# Patient Record
Sex: Male | Born: 1965 | Race: White | Hispanic: No | Marital: Single | Smoking: Current every day smoker
Health system: Southern US, Community
[De-identification: ages and names within clinical notes are randomized; demographics above are authoritative.]

## PROBLEM LIST (undated history)

## (undated) DIAGNOSIS — K4090 Unilateral inguinal hernia, without obstruction or gangrene, not specified as recurrent: Secondary | ICD-10-CM

---

## 2013-01-05 ENCOUNTER — Emergency Department (HOSPITAL_COMMUNITY)
Admission: EM | Admit: 2013-01-05 | Discharge: 2013-01-05 | Disposition: A | Discharge status: Home / Self Care | Payer: Self-pay | Attending: Emergency Medicine | Admitting: Emergency Medicine

## 2013-01-05 ENCOUNTER — Encounter (HOSPITAL_COMMUNITY): Payer: Self-pay | Admitting: Cardiology

## 2013-01-05 DIAGNOSIS — F17200 Nicotine dependence, unspecified, uncomplicated: Secondary | ICD-10-CM | POA: Insufficient documentation

## 2013-01-05 DIAGNOSIS — L0231 Cutaneous abscess of buttock: Secondary | ICD-10-CM | POA: Insufficient documentation

## 2013-01-05 MED ORDER — SULFAMETHOXAZOLE-TRIMETHOPRIM 800-160 MG PO TABS: 1.0000 | ORAL_TABLET | Freq: Two times a day (BID) | ORAL | Status: DC

## 2013-01-05 MED ORDER — SULFAMETHOXAZOLE-TMP DS 800-160 MG PO TABS
1.0000 | ORAL_TABLET | Freq: Once | ORAL | Status: AC
  Administered 2013-01-05: 1 via ORAL
  Filled 2013-01-05: qty 1

## 2013-01-05 MED ORDER — SULFAMETHOXAZOLE-TMP DS 800-160 MG PO TABS
1.0000 | ORAL_TABLET | Freq: Once | ORAL | Status: DC
  Filled 2013-01-05: qty 1

## 2013-01-05 MED ORDER — OXYCODONE-ACETAMINOPHEN 5-325 MG PO TABS: ORAL_TABLET | ORAL | Status: DC

## 2013-01-05 NOTE — ED Provider Notes (Signed)
History    This chart was scribed for a non-physician practitioner, Wynetta Emery, working with Leonette Most B. Bernette Mayers, MD by Frederik Pear, ED Scribe. This patient was seen in room TR08C/TR08C and the patient's care was started at 1403.   CSN: [id number]  Arrival date & time 01/05/13  1215   First MD Initiated Contact with Patient 01/05/13 1403      Chief Complaint  Patient presents with  . Abscess    (Consider location/radiation/quality/duration/timing/severity/associated sxs/prior treatment) The history is provided by the patient and medical records. No language interpreter was used.    HPI Comments: Mark Michael is a 47 y.o. male who presents to the Emergency Department complaining of a gradually worsening, non-draining abscess to the left gluteus that was first noticed suddenly 9 days ago. He reports that he had noticed another abscess to left forearm 11 days ago that he initially thought was a spider bite, but denies seeing a spider on or around his skin. He has no h/o of previous abscesses. Denies fever, nausea, and emesis. He has no chronic medical conditions that require daily medications.  History reviewed. No pertinent past medical history.  History reviewed. No pertinent past surgical history.  History reviewed. No pertinent family history.  History  Substance Use Topics  . Smoking status: Current Every Day Smoker  . Smokeless tobacco: Not on file  . Alcohol Use: No      Review of Systems  Respiratory: Negative for shortness of breath.   Cardiovascular: Negative for chest pain.  Gastrointestinal: Negative for abdominal pain and diarrhea.  Skin:       Abscess  All other systems reviewed and are negative.    Allergies  Codeine and Penicillins  Home Medications   Current Outpatient Rx  Name  Route  Sig  Dispense  Refill  . ibuprofen (ADVIL,MOTRIN) 200 MG tablet   Oral   Take 800 mg by mouth every 6 (six) hours as needed for pain.           BP  141/80  Pulse 66  Temp(Src) 97.7 F (36.5 C) (Oral)  Resp 18  SpO2 100%  Physical Exam  Nursing note and vitals reviewed. Constitutional: He is oriented to person, place, and time. He appears well-developed and well-nourished. No distress.  HENT:  Head: Normocephalic.  Eyes: Conjunctivae and EOM are normal.  Cardiovascular: Normal rate.   Pulmonary/Chest: Effort normal. No stridor.  Musculoskeletal: Normal range of motion.  Neurological: He is alert and oriented to person, place, and time.  Skin:  5 cm abscess to the left gluteus with central fluctuance and minimal surrounding cellulitis. Healing abscess to left forearm.  Psychiatric: He has a normal mood and affect.    ED Course  Procedures (including critical care time)  DIAGNOSTIC STUDIES: Oxygen Saturation is 100% on room air, normal by my interpretation.    COORDINATION OF CARE:  15:01- Discussed planned course of treatment with the patient, ,including an I&D, who is agreeable at this time.  15:05- INCISION AND DRAINAGE Performed by: Wynetta Emery, PA-C Consent: Verbal consent obtained. Risks and benefits: risks, benefits and alternatives were discussed Type: abscess  Body area: left gluteus  Anesthesia: local infiltration  Incision was made with a scalpel.  Local anesthetic: lidocaine 2% with epinephrine  Anesthetic total: 5 ml  Complexity: complex Blunt dissection to break up loculations  Drainage: purulent  Drainage amount: 50 mL  Packing material: None  Patient tolerance: Patient tolerated the procedure well with no immediate complications.  Labs  Reviewed - No data to display No results found.   1. Abscess, gluteal, left       MDM   Mark Michael is a 47 y.o. male with a large fluctuant left gluteal abscess, no systemic signs of infection. I&D performed with extensive purulent drainage.  Filed Vitals:   01/05/13 1228 01/05/13 1541  BP: 141/80 121/90  Pulse: 66 77  Temp: 97.7 F  (36.5 C)   TempSrc: Oral   Resp: 18   SpO2: 100% 100%     Pt verbalized understanding and agrees with care plan. Outpatient follow-up and return precautions given.    Discharge Medication List as of 01/05/2013  3:24 PM    START taking these medications   Details  oxyCODONE-acetaminophen (PERCOCET/ROXICET) 5-325 MG per tablet 1 to 2 tabs PO q6hrs  PRN for pain, Print    sulfamethoxazole-trimethoprim (SEPTRA DS) 800-160 MG per tablet Take 1 tablet by mouth every 12 (twelve) hours., Starting 01/05/2013, Until Discontinued, Print        I personally performed the services described in this documentation, which was scribed in my presence. The recorded information has been reviewed and is accurate.         Wynetta Emery, PA-C 01/06/13 1700

## 2013-01-05 NOTE — ED Notes (Signed)
Pt has abscess on rt forearm that he believes started as spider bite.  Pt states the one on his arm errupted and he squeezed the puss out of it and it seems to be healing.  Area is red and swollen with scabbed over area in center.   Pt also reports another abscess on left buttock.  Pt states it is read swollen and very painful.  Pt denies having ingrown hair that he is aware of just noticed the abscess two days ago and pain is increasing. Pt alert oriented X4

## 2013-01-05 NOTE — ED Notes (Signed)
Pt reports that he noticed an abscess on his left arm a couple of days ago. States that he then developed any area on his buttocks. Denies any fever but does report redness and drainage.

## 2013-01-08 NOTE — ED Provider Notes (Signed)
Medical screening examination/treatment/procedure(s) were performed by non-physician practitioner and as supervising physician I was immediately available for consultation/collaboration.   Hurman Horn, MD 01/08/13 [phone]

## 2014-02-15 ENCOUNTER — Emergency Department: Payer: Self-pay | Admitting: Emergency Medicine

## 2014-02-15 LAB — COMPREHENSIVE METABOLIC PANEL
ALBUMIN: 3.5 g/dL (ref 3.4–5.0)
ALT: 21 U/L (ref 12–78)
ANION GAP: 5 — AB (ref 7–16)
Alkaline Phosphatase: 75 U/L
BUN: 13 mg/dL (ref 7–18)
Bilirubin,Total: 0.5 mg/dL (ref 0.2–1.0)
CALCIUM: 8.7 mg/dL (ref 8.5–10.1)
CHLORIDE: 107 mmol/L (ref 98–107)
CO2: 28 mmol/L (ref 21–32)
CREATININE: 1.07 mg/dL (ref 0.60–1.30)
GLUCOSE: 79 mg/dL (ref 65–99)
OSMOLALITY: 278 (ref 275–301)
Potassium: 3.9 mmol/L (ref 3.5–5.1)
SGOT(AST): 23 U/L (ref 15–37)
SODIUM: 140 mmol/L (ref 136–145)
Total Protein: 6.9 g/dL (ref 6.4–8.2)

## 2014-02-15 LAB — CBC
HCT: 44.4 % (ref 40.0–52.0)
HGB: 14.7 g/dL (ref 13.0–18.0)
MCH: 30.2 pg (ref 26.0–34.0)
MCHC: 33.1 g/dL (ref 32.0–36.0)
MCV: 91 fL (ref 80–100)
PLATELETS: 183 10*3/uL (ref 150–440)
RBC: 4.87 10*6/uL (ref 4.40–5.90)
RDW: 14 % (ref 11.5–14.5)
WBC: 4.3 10*3/uL (ref 3.8–10.6)

## 2014-02-15 LAB — URINALYSIS, COMPLETE: Specific Gravity: 1.027 (ref 1.003–1.030)

## 2014-02-17 LAB — URINE CULTURE

## 2017-02-07 ENCOUNTER — Encounter: Payer: Self-pay | Admitting: Emergency Medicine

## 2017-02-07 ENCOUNTER — Emergency Department
Admission: EM | Admit: 2017-02-07 | Discharge: 2017-02-07 | Disposition: A | Discharge status: Home / Self Care | Payer: Medicaid Other | Attending: Emergency Medicine | Admitting: Emergency Medicine

## 2017-02-07 ENCOUNTER — Emergency Department: Payer: Medicaid Other

## 2017-02-07 DIAGNOSIS — F17210 Nicotine dependence, cigarettes, uncomplicated: Secondary | ICD-10-CM | POA: Diagnosis not present

## 2017-02-07 DIAGNOSIS — R1030 Lower abdominal pain, unspecified: Secondary | ICD-10-CM | POA: Diagnosis present

## 2017-02-07 DIAGNOSIS — K529 Noninfective gastroenteritis and colitis, unspecified: Secondary | ICD-10-CM

## 2017-02-07 HISTORY — DX: Unilateral inguinal hernia, without obstruction or gangrene, not specified as recurrent: K40.90

## 2017-02-07 LAB — COMPREHENSIVE METABOLIC PANEL
ALBUMIN: 4 g/dL (ref 3.5–5.0)
ALT: 13 U/L — ABNORMAL LOW (ref 17–63)
AST: 23 U/L (ref 15–41)
Alkaline Phosphatase: 66 U/L (ref 38–126)
Anion gap: 6 (ref 5–15)
BUN: 16 mg/dL (ref 6–20)
CO2: 28 mmol/L (ref 22–32)
Calcium: 8.9 mg/dL (ref 8.9–10.3)
Chloride: 102 mmol/L (ref 101–111)
Creatinine, Ser: 0.9 mg/dL (ref 0.61–1.24)
GFR calc Af Amer: >60 mL/min (ref >60)
GLUCOSE: 128 mg/dL — AB (ref 65–99)
POTASSIUM: 3.4 mmol/L — AB (ref 3.5–5.1)
Sodium: 136 mmol/L (ref 135–145)
Total Bilirubin: 0.6 mg/dL (ref 0.3–1.2)
Total Protein: 7.3 g/dL (ref 6.5–8.1)

## 2017-02-07 LAB — CBC
HEMATOCRIT: 45.9 % (ref 40.0–52.0)
Hemoglobin: 15.4 g/dL (ref 13.0–18.0)
MCH: 30.3 pg (ref 26.0–34.0)
MCHC: 33.6 g/dL (ref 32.0–36.0)
MCV: 90 fL (ref 80.0–100.0)
Platelets: 155 10*3/uL (ref 150–440)
RBC: 5.1 MIL/uL (ref 4.40–5.90)
RDW: 13.9 % (ref 11.5–14.5)
WBC: 12.5 10*3/uL — AB (ref 3.8–10.6)

## 2017-02-07 LAB — URINALYSIS, COMPLETE (UACMP) WITH MICROSCOPIC
BACTERIA UA: NONE SEEN
BILIRUBIN URINE: NEGATIVE
Glucose, UA: NEGATIVE mg/dL
Hgb urine dipstick: NEGATIVE
KETONES UR: 5 mg/dL — AB
LEUKOCYTES UA: NEGATIVE
Nitrite: NEGATIVE
Protein, ur: 100 mg/dL — AB
RBC / HPF: NONE SEEN RBC/hpf (ref 0–5)
SPECIFIC GRAVITY, URINE: 1.033 — AB (ref 1.005–1.030)
SQUAMOUS EPITHELIAL / LPF: NONE SEEN
pH: 5 (ref 5.0–8.0)

## 2017-02-07 LAB — LIPASE, BLOOD: LIPASE: 21 U/L (ref 11–51)

## 2017-02-07 MED ORDER — METRONIDAZOLE 500 MG PO TABS: 500.0000 mg | ORAL_TABLET | Freq: Three times a day (TID) | ORAL | 0 refills | Status: AC

## 2017-02-07 MED ORDER — IOPAMIDOL (ISOVUE-300) INJECTION 61%
100.0000 mL | Freq: Once | INTRAVENOUS | Status: AC | PRN
  Administered 2017-02-07: 100 mL via INTRAVENOUS
  Filled 2017-02-07: qty 100

## 2017-02-07 MED ORDER — CIPROFLOXACIN HCL 500 MG PO TABS
500.0000 mg | ORAL_TABLET | Freq: Once | ORAL | Status: AC
  Administered 2017-02-07: 500 mg via ORAL

## 2017-02-07 MED ORDER — ONDANSETRON HCL 4 MG/2ML IJ SOLN
4.0000 mg | Freq: Once | INTRAMUSCULAR | Status: AC
  Administered 2017-02-07: 4 mg via INTRAVENOUS
  Filled 2017-02-07: qty 2

## 2017-02-07 MED ORDER — METRONIDAZOLE 500 MG PO TABS
ORAL_TABLET | ORAL | Status: AC
  Filled 2017-02-07: qty 1

## 2017-02-07 MED ORDER — OXYCODONE-ACETAMINOPHEN 5-325 MG PO TABS: 1.0000 | ORAL_TABLET | Freq: Four times a day (QID) | ORAL | 0 refills | Status: DC | PRN

## 2017-02-07 MED ORDER — METRONIDAZOLE 500 MG PO TABS
500.0000 mg | ORAL_TABLET | Freq: Once | ORAL | Status: AC
  Administered 2017-02-07: 500 mg via ORAL

## 2017-02-07 MED ORDER — SODIUM CHLORIDE 0.9 % IV BOLUS (SEPSIS)
1000.0000 mL | Freq: Once | INTRAVENOUS | Status: AC
  Administered 2017-02-07: 1000 mL via INTRAVENOUS

## 2017-02-07 MED ORDER — CIPROFLOXACIN HCL 500 MG PO TABS: 500.0000 mg | ORAL_TABLET | Freq: Two times a day (BID) | ORAL | 0 refills | Status: AC

## 2017-02-07 MED ORDER — IOPAMIDOL (ISOVUE-300) INJECTION 61%
30.0000 mL | Freq: Once | INTRAVENOUS | Status: AC | PRN
  Administered 2017-02-07: 30 mL via ORAL
  Filled 2017-02-07: qty 30

## 2017-02-07 MED ORDER — ONDANSETRON 4 MG PO TBDP: 4.0000 mg | ORAL_TABLET | Freq: Three times a day (TID) | ORAL | 0 refills | Status: DC | PRN

## 2017-02-07 MED ORDER — CIPROFLOXACIN HCL 500 MG PO TABS
ORAL_TABLET | ORAL | Status: AC
  Filled 2017-02-07: qty 1

## 2017-02-07 MED ORDER — FENTANYL CITRATE (PF) 100 MCG/2ML IJ SOLN
50.0000 ug | Freq: Once | INTRAMUSCULAR | Status: AC
  Administered 2017-02-07: 50 ug via INTRAVENOUS
  Filled 2017-02-07: qty 2

## 2017-02-07 NOTE — ED Notes (Signed)
Pt ambulatory to toilet

## 2017-02-07 NOTE — ED Triage Notes (Signed)
Lower abd pain, nausea and vomiting since yesterday.

## 2017-02-07 NOTE — ED Provider Notes (Signed)
[facility] Emergency Department Provider Note  Time seen: 2:24 PM  I have reviewed the triage vital signs and the nursing notes.   HISTORY  Chief Complaint Abdominal Pain    HPI Mark Michael is a 51 y.o. male with a past medical history of an inguinal hernia presents to the emergency department for right lower quadrant abdominal pain. According to the patient for the past 2 days he has been nauseated with vomiting and diarrhea. Denies black or bloody stool. Denies dysuria or hematuria. States the pain is moderate, 7/10 currently. Dull aching type pain located across the lower abdomen but more so on the right lower quadrant. Denies any known fever.  Past Medical History:  Diagnosis Date  . Inguinal hernia     There are no active problems to display for this patient.   History reviewed. No pertinent surgical history.  Prior to Admission medications   Medication Sig Start Date End Date Taking? Authorizing Provider  ibuprofen (ADVIL,MOTRIN) 200 MG tablet Take 800 mg by mouth every 6 (six) hours as needed for pain.    [provider]  oxyCODONE-acetaminophen (PERCOCET/ROXICET) 5-325 MG per tablet 1 to 2 tabs PO q6hrs  PRN for pain 01/05/13   Pisciotta, Joni Reining, PA-C  sulfamethoxazole-trimethoprim (SEPTRA DS) 800-160 MG per tablet Take 1 tablet by mouth every 12 (twelve) hours. 01/05/13   Pisciotta, Joni Reining, PA-C    Allergies  Allergen Reactions  . Codeine Nausea And Vomiting  . Penicillins Hives    No family history on file.  Social History Social History  Substance Use Topics  . Smoking status: Current Every Day Smoker    Packs/day: 0.50    Types: Cigarettes  . Smokeless tobacco: Not on file  . Alcohol use No    Review of Systems Constitutional: Negative for fever Cardiovascular: Negative for chest pain. Respiratory: Negative for shortness of breath. Gastrointestinal:Moderate right lower quadrant pain. Positive for nausea vomiting  diarrhea. Genitourinary: Negative for dysuria. Negative for hematuria Musculoskeletal: Negative for back pain. Neurological: Negative for headache All other ROS negative  ____________________________________________   PHYSICAL EXAM:  VITAL SIGNS: ED Triage Vitals  Enc Vitals Group     BP 02/07/17 1226 132/74     Pulse Rate 02/07/17 1226 78     Resp 02/07/17 1226 20     Temp 02/07/17 1226 98.2 F (36.8 C)     Temp Source 02/07/17 1226 Oral     SpO2 02/07/17 1226 94 %     Weight 02/07/17 1227 190 lb (86.2 kg)     Height 02/07/17 1227 6\' 6"  (1.981 m)     Head Circumference --      Peak Flow --      Pain Score 02/07/17 1225 8     Pain Loc --      Pain Edu? --      Excl. in [place]? --     Constitutional: Alert and oriented. Well appearing and in no distress. Eyes: Normal exam ENT   Head: Normocephalic and atraumatic.   Mouth/Throat: Mucous membranes are moist. Cardiovascular: Normal rate, regular rhythm. No murmur Respiratory: Normal respiratory effort without tachypnea nor retractions. Breath sounds are clear and equal bilaterally. No wheezes/rales/rhonchi. Gastrointestinal: Soft, moderate right lower quadrant tenderness palpation, mild suprapubic tenderness palpation. Abdomen otherwise benign. No rebound or guarding. No distention. Musculoskeletal: Nontender with normal range of motion in all extremities.  Neurologic:  Normal speech and language. No gross focal neurologic deficits  Skin:  Skin is warm,  dry and intact.  Psychiatric: Mood and affect are normal.  ____________________________________________   RADIOLOGY  CT consistent with colitis. Normal appendix.  ____________________________________________   INITIAL IMPRESSION / ASSESSMENT AND PLAN / ED COURSE  Pertinent labs & imaging results that were available during my care of the patient were reviewed by me and considered in my medical decision making (see chart for details).  Patient presents for 2 days  of right lower quadrant pain, moderate right lower quadrant abdominal tenderness to palpation with mild leukocytosis of 12,500. Given the patient's moderate tenderness palpation with mild cytosis we'll proceed with a CT of the abdomen/pelvis to rule out intra-abdominal pathology such as appendicitis or colitis. We will treat pain and nausea. Patient agreeable to plan.  CT scan consistent with colitis. Normal appendix. We will discharge with ciprofloxacin and Flagyl. [facility] I discussed return precautions for any fever or worsening abdominal pain  ____________________________________________   FINAL CLINICAL IMPRESSION(S) / ED DIAGNOSES  Right lower quadrant abdominal pain Nausea vomiting diarrhea Colitis   Minna AntisPaduchowski, Farrel Guimond, MD 02/07/17 [phone]

## 2020-01-16 ENCOUNTER — Emergency Department
Admission: EM | Admit: 2020-01-16 | Discharge: 2020-01-16 | Disposition: A | Discharge status: Home / Self Care | Payer: No Typology Code available for payment source | Attending: Emergency Medicine | Admitting: Emergency Medicine

## 2020-01-16 ENCOUNTER — Emergency Department: Payer: No Typology Code available for payment source

## 2020-01-16 ENCOUNTER — Other Ambulatory Visit: Payer: Self-pay

## 2020-01-16 ENCOUNTER — Encounter: Payer: Self-pay | Admitting: Emergency Medicine

## 2020-01-16 DIAGNOSIS — Y9289 Other specified places as the place of occurrence of the external cause: Secondary | ICD-10-CM | POA: Insufficient documentation

## 2020-01-16 DIAGNOSIS — F17210 Nicotine dependence, cigarettes, uncomplicated: Secondary | ICD-10-CM | POA: Diagnosis not present

## 2020-01-16 DIAGNOSIS — S82202A Unspecified fracture of shaft of left tibia, initial encounter for closed fracture: Secondary | ICD-10-CM | POA: Insufficient documentation

## 2020-01-16 DIAGNOSIS — Y990 Civilian activity done for income or pay: Secondary | ICD-10-CM | POA: Insufficient documentation

## 2020-01-16 DIAGNOSIS — W208XXA Other cause of strike by thrown, projected or falling object, initial encounter: Secondary | ICD-10-CM | POA: Diagnosis not present

## 2020-01-16 DIAGNOSIS — F1210 Cannabis abuse, uncomplicated: Secondary | ICD-10-CM | POA: Insufficient documentation

## 2020-01-16 DIAGNOSIS — S82402A Unspecified fracture of shaft of left fibula, initial encounter for closed fracture: Secondary | ICD-10-CM | POA: Diagnosis not present

## 2020-01-16 DIAGNOSIS — Z23 Encounter for immunization: Secondary | ICD-10-CM | POA: Insufficient documentation

## 2020-01-16 DIAGNOSIS — Y9389 Activity, other specified: Secondary | ICD-10-CM | POA: Insufficient documentation

## 2020-01-16 DIAGNOSIS — S8992XA Unspecified injury of left lower leg, initial encounter: Secondary | ICD-10-CM | POA: Diagnosis present

## 2020-01-16 LAB — CBC WITH DIFFERENTIAL/PLATELET
Abs Immature Granulocytes: 0.13 10*3/uL — ABNORMAL HIGH (ref 0.00–0.07)
Basophils Absolute: 0.1 10*3/uL (ref 0.0–0.1)
Basophils Relative: 0 %
Eosinophils Absolute: 0.1 10*3/uL (ref 0.0–0.5)
Eosinophils Relative: 0 %
HCT: 43.8 % (ref 39.0–52.0)
Hemoglobin: 14.5 g/dL (ref 13.0–17.0)
Immature Granulocytes: 1 %
Lymphocytes Relative: 6 %
Lymphs Abs: 1.2 10*3/uL (ref 0.7–4.0)
MCH: 30.2 pg (ref 26.0–34.0)
MCHC: 33.1 g/dL (ref 30.0–36.0)
MCV: 91.3 fL (ref 80.0–100.0)
Monocytes Absolute: 0.8 10*3/uL (ref 0.1–1.0)
Monocytes Relative: 4 %
Neutro Abs: 16.3 10*3/uL — ABNORMAL HIGH (ref 1.7–7.7)
Neutrophils Relative %: 89 %
Platelets: 181 10*3/uL (ref 150–400)
RBC: 4.8 MIL/uL (ref 4.22–5.81)
RDW: 13 % (ref 11.5–15.5)
WBC: 18.5 10*3/uL — ABNORMAL HIGH (ref 4.0–10.5)
nRBC: 0 % (ref 0.0–0.2)

## 2020-01-16 LAB — COMPREHENSIVE METABOLIC PANEL
ALT: 15 U/L (ref 0–44)
AST: 24 U/L (ref 15–41)
Albumin: 3.9 g/dL (ref 3.5–5.0)
Alkaline Phosphatase: 56 U/L (ref 38–126)
Anion gap: 10 (ref 5–15)
BUN: 18 mg/dL (ref 6–20)
CO2: 26 mmol/L (ref 22–32)
Calcium: 8.8 mg/dL — ABNORMAL LOW (ref 8.9–10.3)
Chloride: 105 mmol/L (ref 98–111)
Creatinine, Ser: 1.21 mg/dL (ref 0.61–1.24)
GFR calc Af Amer: >60 mL/min (ref >60)
GFR calc non Af Amer: >60 mL/min (ref >60)
Glucose, Bld: 188 mg/dL — ABNORMAL HIGH (ref 70–99)
Potassium: 3.9 mmol/L (ref 3.5–5.1)
Sodium: 141 mmol/L (ref 135–145)
Total Bilirubin: 0.5 mg/dL (ref 0.3–1.2)
Total Protein: 6.8 g/dL (ref 6.5–8.1)

## 2020-01-16 MED ORDER — ONDANSETRON 8 MG PO TBDP: 8.0000 mg | ORAL_TABLET | Freq: Three times a day (TID) | ORAL | 0 refills | Status: AC | PRN

## 2020-01-16 MED ORDER — ONDANSETRON 4 MG PO TBDP
4.0000 mg | ORAL_TABLET | Freq: Once | ORAL | Status: AC
  Administered 2020-01-16: 4 mg via ORAL
  Filled 2020-01-16: qty 1

## 2020-01-16 MED ORDER — TETANUS-DIPHTH-ACELL PERTUSSIS 5-2.5-18.5 LF-MCG/0.5 IM SUSP
0.5000 mL | Freq: Once | INTRAMUSCULAR | Status: AC
  Administered 2020-01-16: 0.5 mL via INTRAMUSCULAR
  Filled 2020-01-16: qty 0.5

## 2020-01-16 MED ORDER — HYDROMORPHONE HCL 1 MG/ML IJ SOLN
1.0000 mg | Freq: Once | INTRAMUSCULAR | Status: AC
  Administered 2020-01-16: 1 mg via INTRAVENOUS
  Filled 2020-01-16: qty 1

## 2020-01-16 MED ORDER — OXYCODONE-ACETAMINOPHEN 10-325 MG PO TABS: 1.0000 | ORAL_TABLET | Freq: Four times a day (QID) | ORAL | 0 refills | Status: AC | PRN

## 2020-01-16 NOTE — ED Notes (Signed)
See triage note.  Presents s/p injury to left lower leg  States he had some metal fall on left  Positive swelling noted  Abrasion noted to knee and lower leg

## 2020-01-16 NOTE — Discharge Instructions (Signed)
Call Dr. Samuel Germany office today to get an appointment to be seen in the office next week.  This will give time for the swelling in your leg to go down.  Until that time ice and elevation to reduce swelling.  Wear the knee immobilizer until you are seen by the orthopedist.  Do not put any weight on your left leg.  Use crutches anytime you are up walking.  Pain medication was sent to the pharmacy along with Zofran for nausea.  Be aware that the pain medication could cause drowsiness and increase your risk for falling.  Take pain medication after you have eaten something.  You are also out of work until the orthopedist has seen you and being cleared.  The tibia fracture can be unstable and worsen if you continue to put weight on your left leg.

## 2020-01-16 NOTE — ED Provider Notes (Signed)
[facility] Emergency Department Provider Note  ____________________________________________   First MD Initiated Contact with Patient 01/16/20 0732     (approximate)  I have reviewed the triage vital signs and the nursing notes.   HISTORY  Chief Complaint Leg Pain   HPI Mark Michael is a 54 y.o. male presents to the ED with a Workmen's Comp. injury that happened this morning.  Patient states that a large piece of sheet metal fell and hit his left knee.  He denies any head injury or loss of consciousness.  Patient was brought by a Mudlogger.  Patient is unaware of his last tetanus that he received.  He denies any previous injury to his left knee.  Currently rates his pain as 10/10.       Past Medical History:  Diagnosis Date  . Inguinal hernia     There are no problems to display for this patient.   History reviewed. No pertinent surgical history.  Prior to Admission medications   Medication Sig Start Date End Date Taking? Authorizing Provider  ondansetron (ZOFRAN ODT) 8 MG disintegrating tablet Take 1 tablet (8 mg total) by mouth every 8 (eight) hours as needed for nausea or vomiting. 01/16/20   Johnn Hai, PA-C  oxyCODONE-acetaminophen (PERCOCET) 10-325 MG tablet Take 1 tablet by mouth every 6 (six) hours as needed for pain. 01/16/20 01/15/21  Johnn Hai, PA-C    Allergies Codeine and Penicillins  No family history on file.  Social History Social History   Tobacco Use  . Smoking status: Current Every Day Smoker    Packs/day: 0.50    Types: Cigarettes  . Smokeless tobacco: Never Used  Substance Use Topics  . Alcohol use: No  . Drug use: Yes    Types: Marijuana    Review of Systems Constitutional: No fever/chills Eyes: No visual changes. Cardiovascular: Denies chest pain. Respiratory: Denies shortness of breath. Gastrointestinal: No abdominal pain.  Positive nausea, no vomiting.   Musculoskeletal: Positive for left  knee pain. Skin: Positive for abrasions left knee. Neurological: Negative for headaches, focal weakness or numbness. ___________________________________________   PHYSICAL EXAM:  VITAL SIGNS: ED Triage Vitals  Enc Vitals Group     BP 01/16/20 0649 104/70     Pulse Rate 01/16/20 0649 71     Resp 01/16/20 0649 20     Temp 01/16/20 0649 (!) 97.4 F (36.3 C)     Temp Source 01/16/20 0649 Oral     SpO2 01/16/20 0649 97 %     Weight 01/16/20 0651 206 lb (93.4 kg)     Height 01/16/20 0651 6\' 5"  (1.956 m)     Head Circumference --      Peak Flow --      Pain Score 01/16/20 0650 10     Pain Loc --      Pain Edu? --      Excl. in [place]? --     Constitutional: Alert and oriented. Well appearing and in no acute distress. Eyes: Conjunctivae are normal.  Head: Atraumatic. Neck: No stridor.   Cardiovascular: Normal rate, regular rhythm. Grossly normal heart sounds.  Good peripheral circulation. Respiratory: Normal respiratory effort.  No retractions. Lungs CTAB. Musculoskeletal: On examination of left lower extremity there is moderate soft tissue swelling along with 2 large superficial abrasions on the anterior aspect of the left knee.  Range of motion is restricted secondary to patient's pain.  Effusion is present.  Light palpation is extremely painful for  the patient to tolerate.  Patient was able to flex and extend his foot and nontender palpation of his ankle.  Pulses present.  Capillary refills less than 3 seconds and motor sensory function intact.  No tenderness was noted above the knee area. Neurologic:  Normal speech and language. No gross focal neurologic deficits are appreciated.  Skin:  Skin is warm, dry.  Multiple abrasions are noted to the left knee anteriorly.  No foreign body or active bleeding. Psychiatric: Mood and affect are normal. Speech and behavior are normal.  ____________________________________________   LABS (all labs ordered are listed, but only abnormal results are  displayed)  Labs Reviewed  CBC WITH DIFFERENTIAL/PLATELET - Abnormal; Notable for the following components:      Result Value   WBC 18.5 (*)    Neutro Abs 16.3 (*)    Abs Immature Granulocytes 0.13 (*)    All other components within normal limits  COMPREHENSIVE METABOLIC PANEL - Abnormal; Notable for the following components:   Glucose, Bld 188 (*)    Calcium 8.8 (*)    All other components within normal limits    RADIOLOGY   Official radiology report(s): DG Tibia/Fibula Left  Result Date: 01/16/2020 CLINICAL DATA:  Posttraumatic knee pain EXAM: LEFT TIBIA AND FIBULA - 2 VIEW COMPARISON:  None. FINDINGS: Fibular head and neck fracture which is nondisplaced. Potential for a lateral tibial plateau fracture, recommend dedicated knee series. The mid and distal tibia and fibula are intact. IMPRESSION: 1. Fibular head and neck fracture which is nondisplaced. 2. Possible lateral tibial plateau fracture, recommend dedicated knee series versus CT. Electronically Signed   By: Marnee Spring M.D.   On: 01/16/2020 07:53   CT Knee Left Wo Contrast  Result Date: 01/16/2020 CLINICAL DATA:  Injured leg at work. Evaluate tibial plateau fracture. EXAM: CT OF THE left KNEE WITHOUT CONTRAST TECHNIQUE: Multidetector CT imaging of the left knee was performed according to the standard protocol. Multiplanar CT image reconstructions were also generated. COMPARISON:  Radiographs, same date. FINDINGS: There is vertical/longitudinal fracture involving the far lateral aspect of the lateral tibia. The fracture only involves a very small portion of the far lateral aspect of the tibial articular surface. There is a complex comminuted fracture involving the fibular head. The tibiofibular joint space is maintained. No femur or patella fractures are identified. Grossly by CT the cruciate ligaments and MCL complex are intact. Difficult to evaluate the lateral collateral ligaments. Moderate-sized lipohemarthrosis is noted. There  is also a moderate-sized lateral subcutaneous hematoma. IMPRESSION: 1. Small vertical/longitudinal fracture involving the lateral aspect of the tibia. The fracture only involves a very small portion of the far lateral aspect of the tibial articular surface. 2. Complex comminuted fracture involving the fibular head. 3. Moderate-sized lipohemarthrosis. 4. Grossly by CT the cruciate ligaments and MCL complex are intact. Difficult to evaluate the lateral collateral ligaments. Electronically Signed   By: Rudie Meyer M.D.   On: 01/16/2020 10:02    ____________________________________________   PROCEDURES  Procedure(s) performed (including Critical Care):  Procedures Knee immobilizer and crutches per nursing staff.  ____________________________________________   INITIAL IMPRESSION / ASSESSMENT AND PLAN / ED COURSE  As part of my medical decision making, I reviewed the following data within the electronic MEDICAL RECORD NUMBER Notes from prior ED visits and  Controlled Substance Database  54 year old male presents to the ED for a Workmen's Comp. injury that occurred this morning.  Patient was hit by a sheet metal that struck his left knee.  Patient was given Dilaudid prior to going to x-ray.  X-rays showed fracture of the proximal tib/fib and CTs scan confirmed that patient has a comminuted fracture of the proximal fibula and a nondisplaced fracture of the lateral tibia.  Dr. Martha Clan was notified and agrees with plan.  Patient was placed in a knee immobilizer and given crutches.  He is very aware that he should not apply any weight to his leg and use crutches anytime he is up.  Patient was given a prescription for Zofran if needed for nausea and Percocet.  He was instructed to call Dr. Samuel Germany office to make an appointment to be seen next week.  He is also encouraged to ice and elevate to reduce swelling which also should help with pain.  He is to return to the emergency department if any severe  worsening of his symptoms or urgent concerns.  Patient is aware that the pain medication could cause drowsiness and increase his risk for falling.  A note was written for his work letting them know that he is out of work until cleared by orthopedics.  ____________________________________________   FINAL CLINICAL IMPRESSION(S) / ED DIAGNOSES  Final diagnoses:  Fracture tibia/fibula, left, closed, initial encounter     ED Discharge Orders         Ordered    oxyCODONE-acetaminophen (PERCOCET) 10-325 MG tablet  Every 6 hours PRN     01/16/20 1129    ondansetron (ZOFRAN ODT) 8 MG disintegrating tablet  Every 8 hours PRN     01/16/20 1129           Note:  This document was prepared using Dragon voice recognition software and may include unintentional dictation errors.    Tommi Rumps, PA-C 01/16/20 1439    Sharyn Creamer, MD 01/16/20 [phone]

## 2020-01-16 NOTE — ED Triage Notes (Signed)
Pt to triage via w/c; st sheet metal fell on left lower leg while at work (pt employed with [employer]--[employer] required per workers comp profile)

## 2021-08-25 IMAGING — CT CT KNEE*L* W/O CM
3 of 4 series · 14 of 33 positions shown, 16 images · non-contrast
Comparison: Radiographs, same date.

CLINICAL DATA: Injured leg at work. Evaluate tibial plateau
fracture.

EXAM:
CT OF THE left KNEE WITHOUT CONTRAST
TECHNIQUE: Multidetector CT imaging of the left knee was performed according to
the standard protocol. Multiplanar CT image reconstructions were
also generated.

[Series 5: axial st · axial · 0.41mm/px · z∈[+293,+458]mm · 6 of 145 slices shown, 8 images]
[im 23/145  soft-tissue]
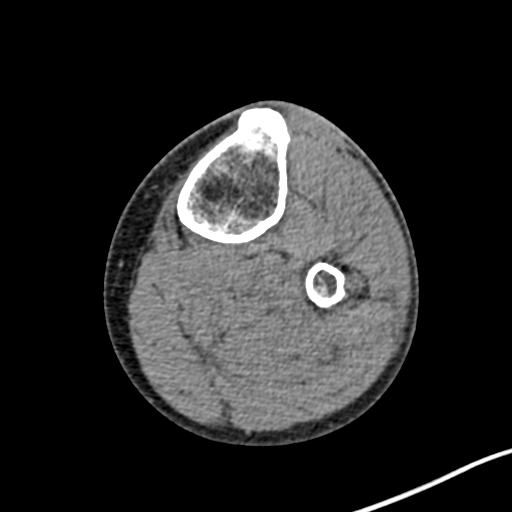
[im 23/145  bone]
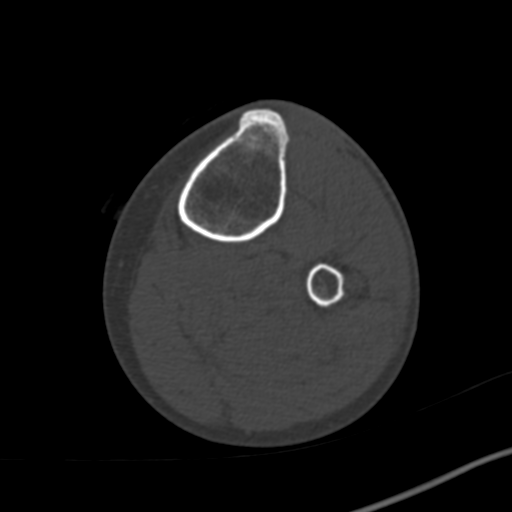
[im 45/145  bone]
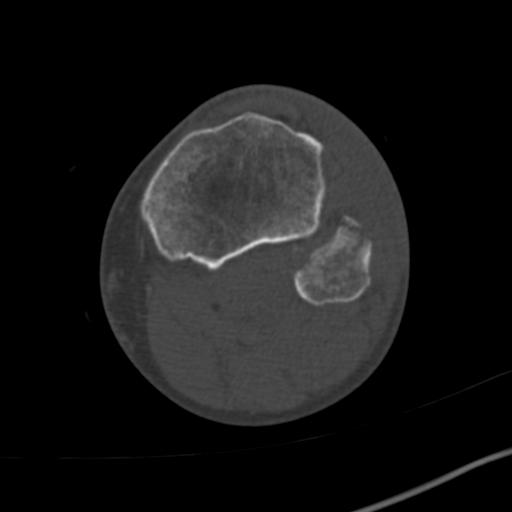
[im 67/145  bone]
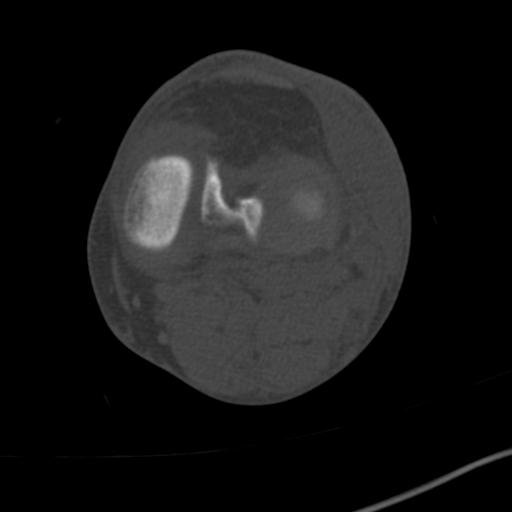
[im 89/145  bone]
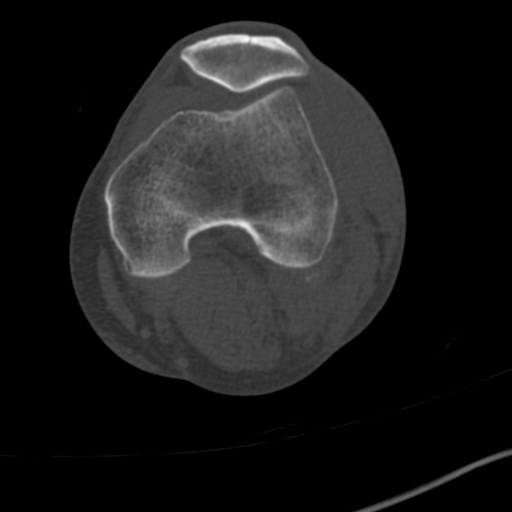
[im 111/145  soft-tissue]
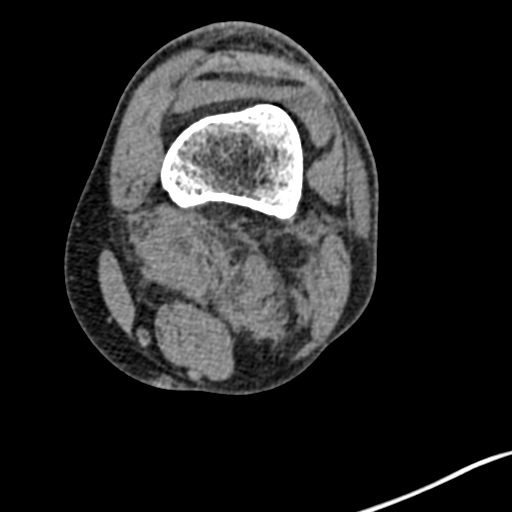
[im 111/145  bone]
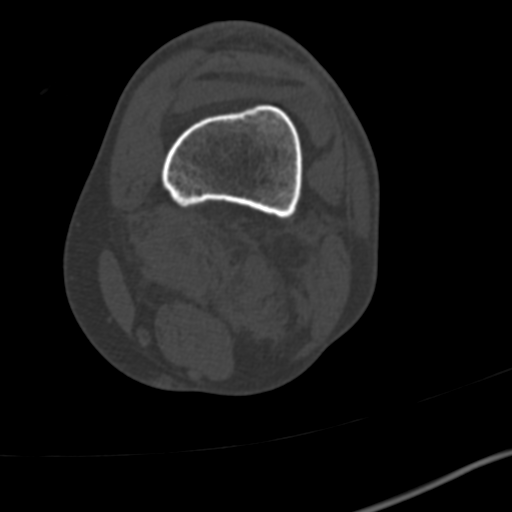
[im 133/145  bone]
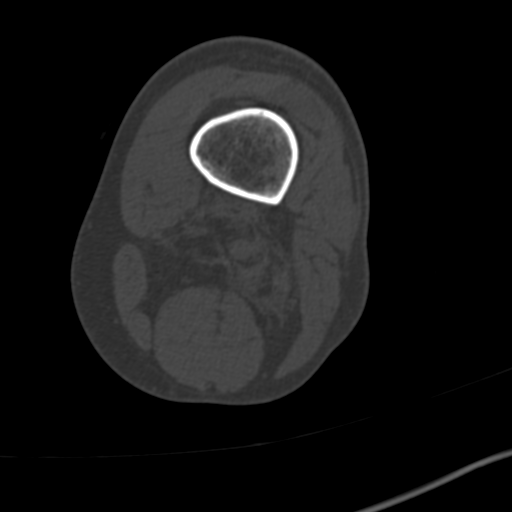

[Series 7: sagittal bone · sagittal · 0.35mm/px · 5 of 90 slices shown]
[im 15/90  bone]
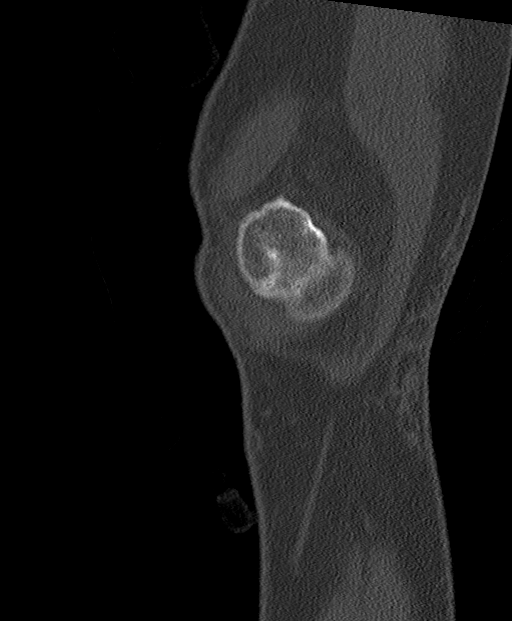
[im 30/90  bone]
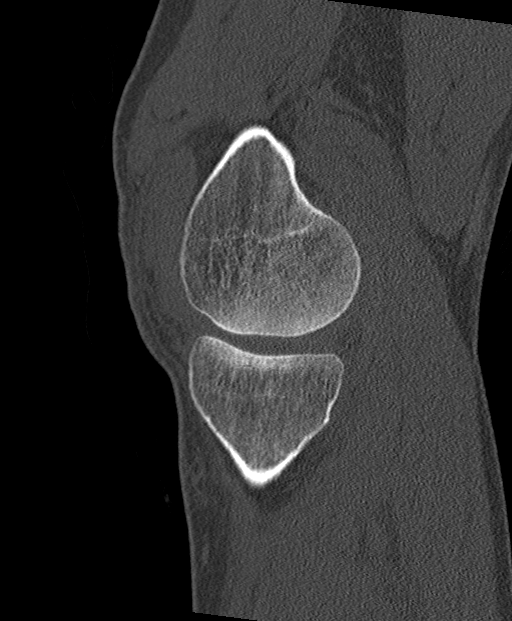
[im 45/90  bone]
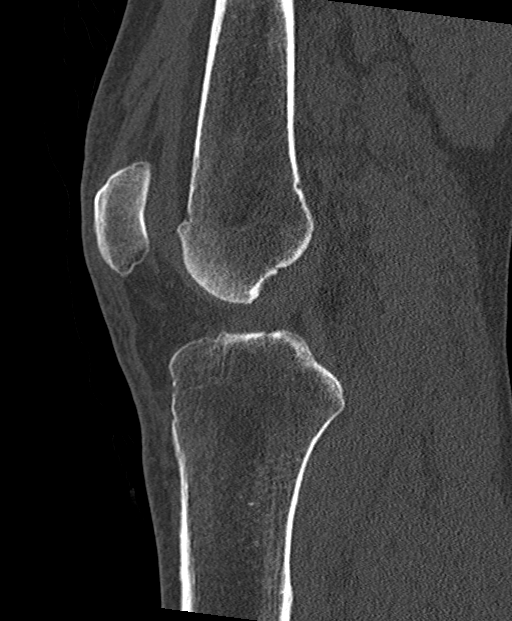
[im 60/90  bone]
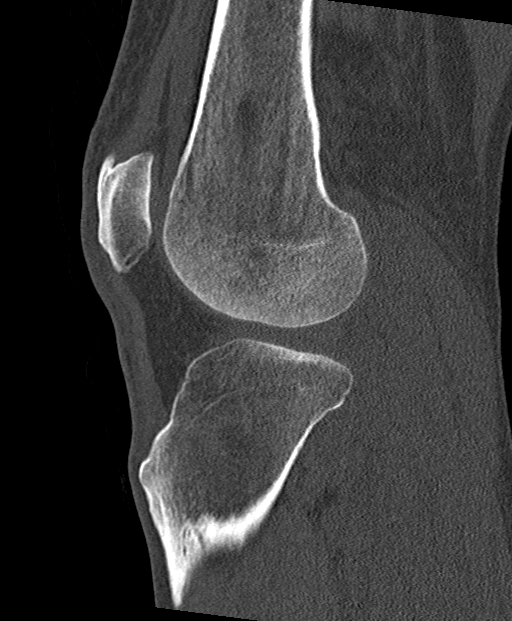
[im 75/90  bone]
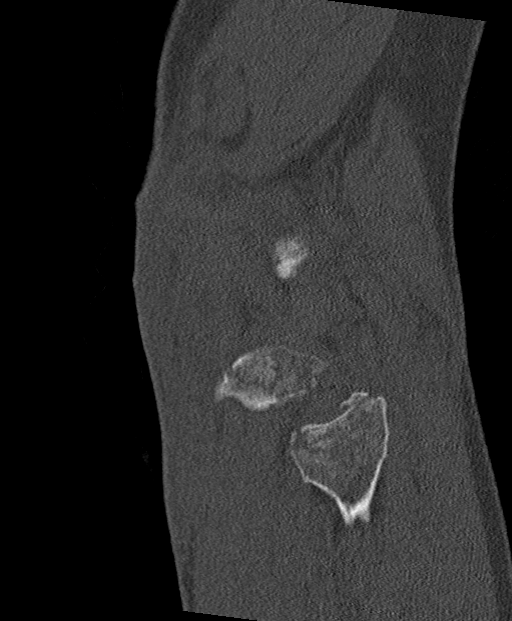

[Series 8: coronal st · coronal · 0.26mm/px · 3 of 114 slices shown]
[im 23/114  bone]
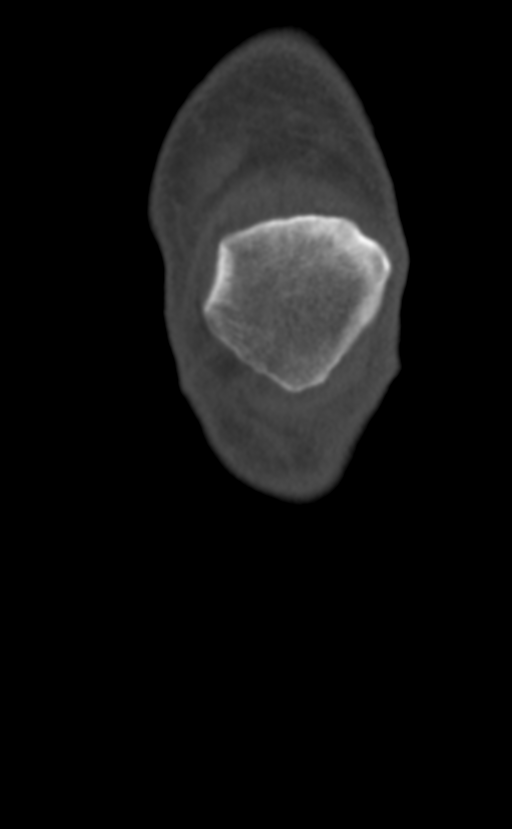
[im 46/114  bone]
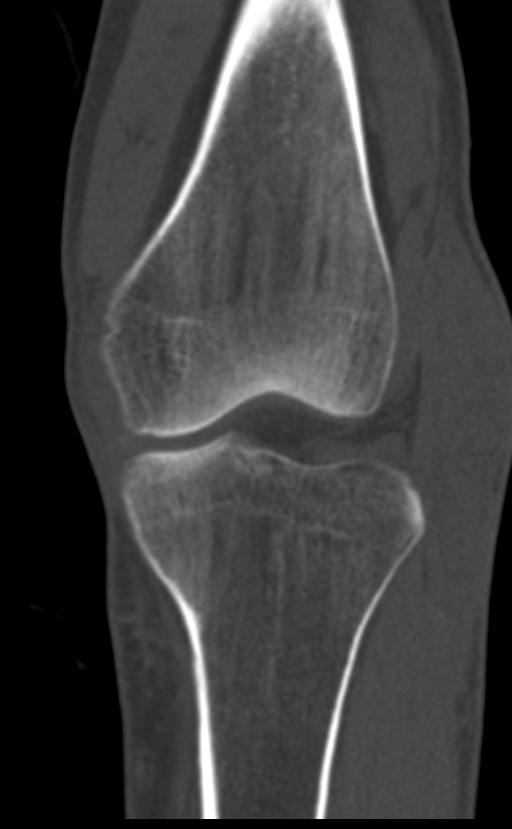
[im 68/114  bone]
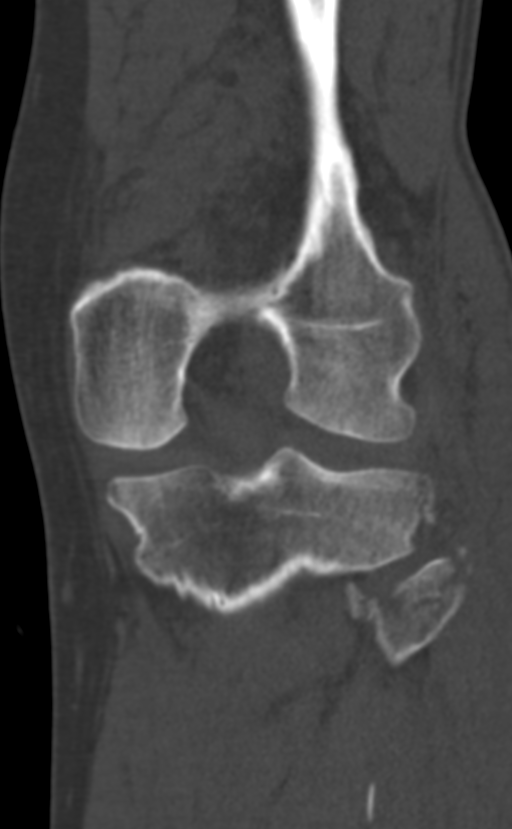

[14 of 33 positions shown; findings below may reference images not displayed]

FINDINGS: There is vertical/longitudinal fracture involving the far lateral
aspect of the lateral tibia. The fracture only involves a very small
portion of the far lateral aspect of the tibial articular surface.

There is a complex comminuted fracture involving the fibular head.
The tibiofibular joint space is maintained.

No femur or patella fractures are identified.

Grossly by CT the cruciate ligaments and MCL complex are intact.
Difficult to evaluate the lateral collateral ligaments.

Moderate-sized lipohemarthrosis is noted. There is also a
moderate-sized lateral subcutaneous hematoma.
IMPRESSION: 1. Small vertical/longitudinal fracture involving the lateral aspect
of the tibia. The fracture only involves a very small portion of the
far lateral aspect of the tibial articular surface.
2. Complex comminuted fracture involving the fibular head.
3. Moderate-sized lipohemarthrosis.
4. Grossly by CT the cruciate ligaments and MCL complex are intact.
Difficult to evaluate the lateral collateral ligaments.
